# Patient Record
Sex: Male | Born: 1970 | Race: White | Hispanic: No | Marital: Single
Health system: Southern US, Community
[De-identification: ages and names within clinical notes are randomized; demographics above are authoritative.]

---

## 2013-10-03 ENCOUNTER — Emergency Department (HOSPITAL_COMMUNITY): Payer: Self-pay

## 2013-10-03 ENCOUNTER — Emergency Department (HOSPITAL_COMMUNITY)
Admission: EM | Admit: 2013-10-03 | Discharge: 2013-10-03 | Disposition: A | Discharge status: Home / Self Care | Payer: Self-pay | Attending: Emergency Medicine | Admitting: Emergency Medicine

## 2013-10-03 ENCOUNTER — Encounter (HOSPITAL_COMMUNITY): Payer: Self-pay | Admitting: Emergency Medicine

## 2013-10-03 DIAGNOSIS — IMO0002 Reserved for concepts with insufficient information to code with codable children: Secondary | ICD-10-CM | POA: Insufficient documentation

## 2013-10-03 DIAGNOSIS — Y92410 Unspecified street and highway as the place of occurrence of the external cause: Secondary | ICD-10-CM | POA: Insufficient documentation

## 2013-10-03 DIAGNOSIS — Y9389 Activity, other specified: Secondary | ICD-10-CM | POA: Insufficient documentation

## 2013-10-03 DIAGNOSIS — M25512 Pain in left shoulder: Secondary | ICD-10-CM

## 2013-10-03 DIAGNOSIS — S4980XA Other specified injuries of shoulder and upper arm, unspecified arm, initial encounter: Secondary | ICD-10-CM | POA: Insufficient documentation

## 2013-10-03 DIAGNOSIS — S46909A Unspecified injury of unspecified muscle, fascia and tendon at shoulder and upper arm level, unspecified arm, initial encounter: Secondary | ICD-10-CM | POA: Insufficient documentation

## 2013-10-03 DIAGNOSIS — M549 Dorsalgia, unspecified: Secondary | ICD-10-CM

## 2013-10-03 MED ORDER — NAPROXEN 500 MG PO TABS: 500.0000 mg | ORAL_TABLET | Freq: Two times a day (BID) | ORAL | Status: AC

## 2013-10-03 MED ORDER — HYDROCODONE-ACETAMINOPHEN 5-325 MG PO TABS
2.0000 | ORAL_TABLET | Freq: Once | ORAL | Status: AC
  Administered 2013-10-03: 2 via ORAL
  Filled 2013-10-03: qty 2
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
## 2013-10-03 NOTE — ED Notes (Signed)
Patient transported to X-ray 
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
## 2013-10-07 NOTE — ED Provider Notes (Signed)
Medical screening examination/treatment/procedure(s) were performed by non-physician practitioner and as supervising physician I was immediately available for consultation/collaboration.  Camron Essman Y. Leylah Tarnow, MD 10/07/13 1419 
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
